# Patient Record
Sex: Female | Born: 1955 | Race: White | Hispanic: No
Health system: Southern US, Community
[De-identification: ages and names within clinical notes are randomized; demographics above are authoritative.]

---

## 2012-02-17 ENCOUNTER — Observation Stay: Payer: Self-pay | Admitting: Student

## 2012-02-17 LAB — URINALYSIS, COMPLETE
Bilirubin,UR: NEGATIVE
Glucose,UR: NEGATIVE mg/dL (ref 0–75)
Leukocyte Esterase: NEGATIVE
Nitrite: NEGATIVE
Ph: 5 (ref 4.5–8.0)
Specific Gravity: 1.027 (ref 1.003–1.030)
Squamous Epithelial: 4
WBC UR: 3 /HPF (ref 0–5)

## 2012-02-17 LAB — CBC
MCH: 31.8 pg (ref 26.0–34.0)
Platelet: 196 10*3/uL (ref 150–440)
RBC: 3.79 10*6/uL — ABNORMAL LOW (ref 3.80–5.20)
WBC: 4.1 10*3/uL (ref 3.6–11.0)

## 2012-02-17 LAB — BASIC METABOLIC PANEL
Anion Gap: 7 (ref 7–16)
Calcium, Total: 8.1 mg/dL — ABNORMAL LOW (ref 8.5–10.1)
Creatinine: 1.03 mg/dL (ref 0.60–1.30)
EGFR (African American): >60
EGFR (Non-African Amer.): >60
Glucose: 90 mg/dL (ref 65–99)
Osmolality: 291 (ref 275–301)
Potassium: 3.1 mmol/L — ABNORMAL LOW (ref 3.5–5.1)

## 2012-02-17 LAB — DRUG SCREEN, URINE
Amphetamines, Ur Screen: NEGATIVE (ref ?–1000)
Barbiturates, Ur Screen: NEGATIVE (ref ?–200)
Benzodiazepine, Ur Scrn: NEGATIVE (ref ?–200)
Cannabinoid 50 Ng, Ur ~~LOC~~: NEGATIVE (ref ?–50)
MDMA (Ecstasy)Ur Screen: NEGATIVE (ref ?–500)
Methadone, Ur Screen: NEGATIVE (ref ?–300)
Opiate, Ur Screen: POSITIVE (ref ?–300)
Tricyclic, Ur Screen: NEGATIVE (ref ?–1000)

## 2012-02-17 LAB — HEPATIC FUNCTION PANEL A (ARMC)
Albumin: 3.5 g/dL (ref 3.4–5.0)
Bilirubin, Direct: <0.1 mg/dL (ref 0.00–0.20)
Bilirubin,Total: 0.2 mg/dL (ref 0.2–1.0)
SGOT(AST): 22 U/L (ref 15–37)
SGPT (ALT): 15 U/L (ref 12–78)

## 2012-02-17 LAB — ACETAMINOPHEN LEVEL: Acetaminophen: <2 ug/mL

## 2012-02-18 LAB — BASIC METABOLIC PANEL
Anion Gap: 8 (ref 7–16)
BUN: 11 mg/dL (ref 7–18)
Chloride: 113 mmol/L — ABNORMAL HIGH (ref 98–107)
Creatinine: 0.92 mg/dL (ref 0.60–1.30)
EGFR (African American): >60
Osmolality: 285 (ref 275–301)
Potassium: 3.2 mmol/L — ABNORMAL LOW (ref 3.5–5.1)

## 2012-02-18 LAB — MAGNESIUM: Magnesium: 2.2 mg/dL

## 2013-12-17 IMAGING — CT CT HEAD WITHOUT CONTRAST
1 series · 16 of 30 positions shown, 20 images · non-contrast
Comparison: none

REASON FOR EXAM: ams
COMMENTS:

PROCEDURE:     CT  - CT HEAD WITHOUT CONTRAST  - February 17, 2012  [DATE]
RESULT:     History: Overdose.
Comparison Study: No prior.

[Series 2: soft tissue · axial · 0.39mm/px · z∈[-34,+101]mm · 16 of 31 slices shown, 20 images]
[im 2/31  brain]
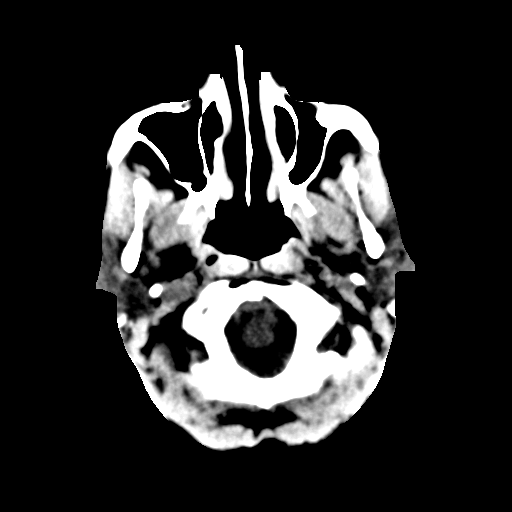
[im 2/31  bone]
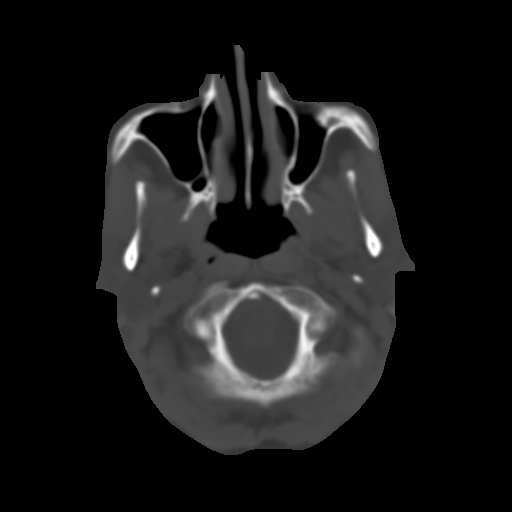
[im 4/31  brain]
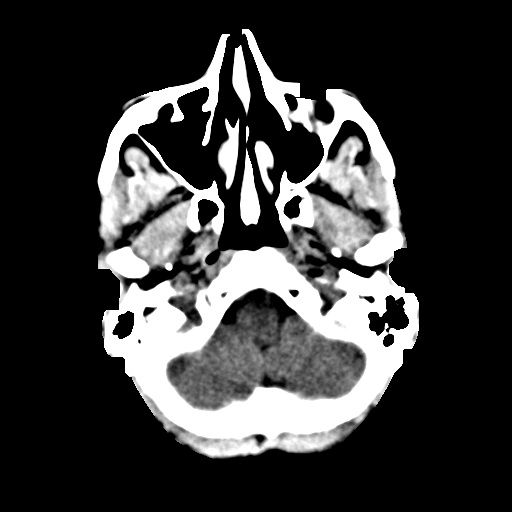
[im 6/31  brain]
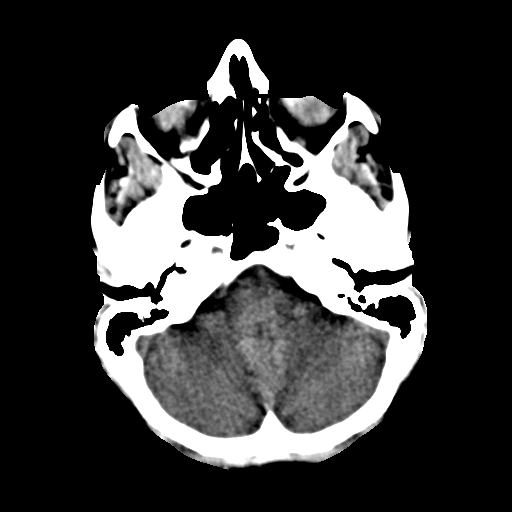
[im 8/31  brain]
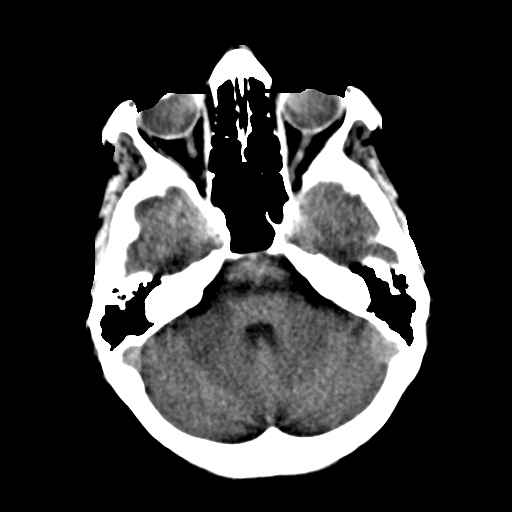
[im 9/31  brain]
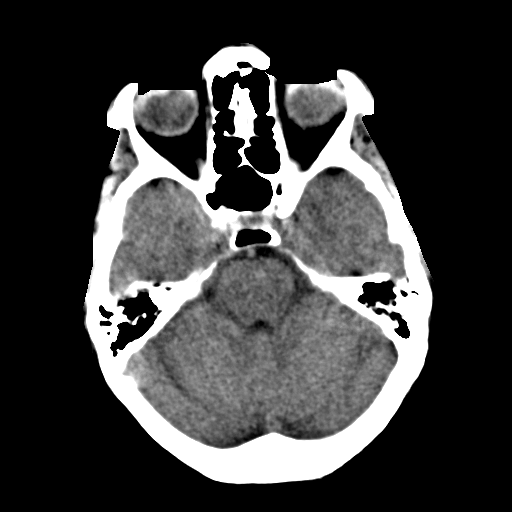
[im 9/31  bone]
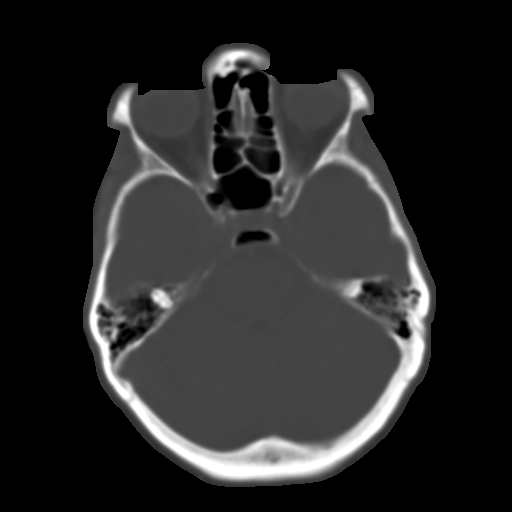
[im 11/31  brain]
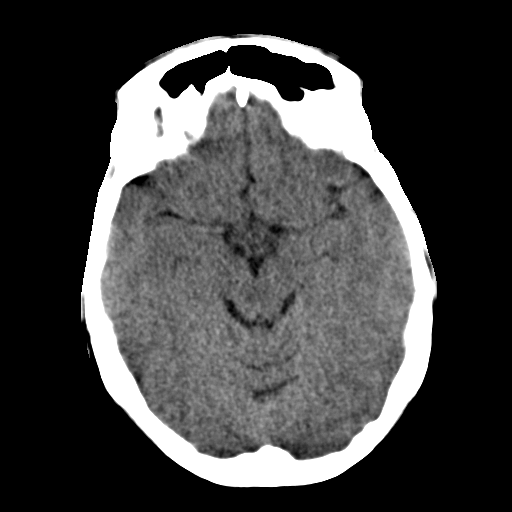
[im 13/31  brain]
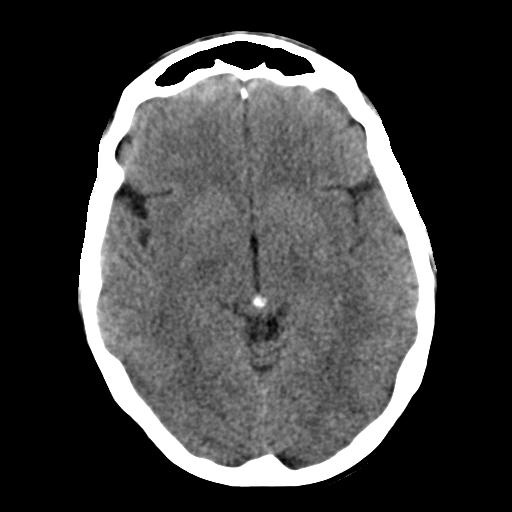
[im 15/31  brain]
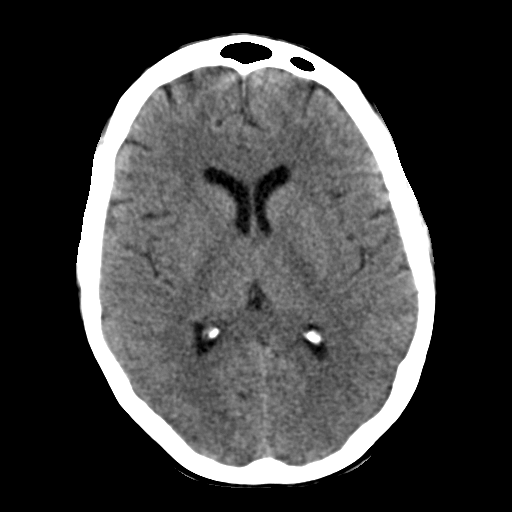
[im 16/31  brain]
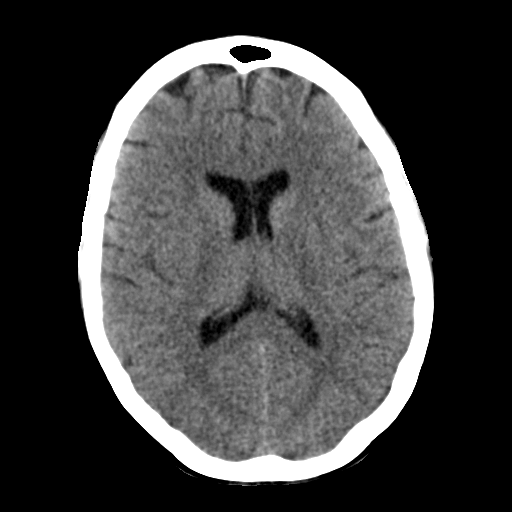
[im 16/31  bone]
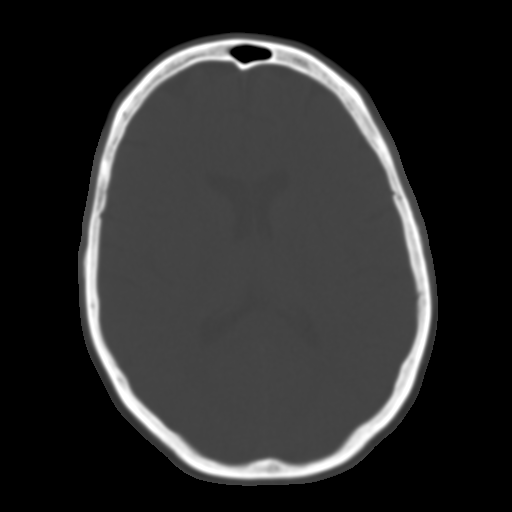
[im 18/31  brain]
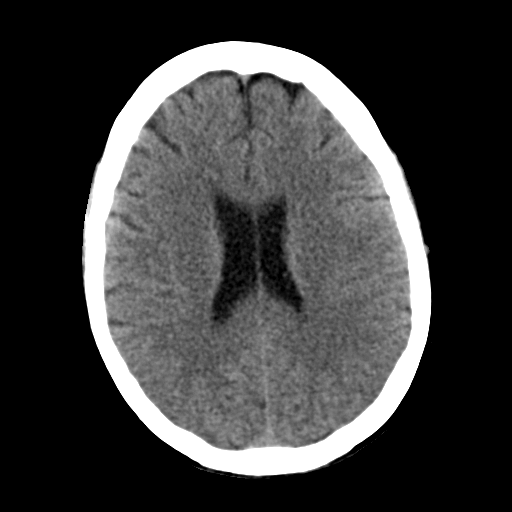
[im 20/31  brain]
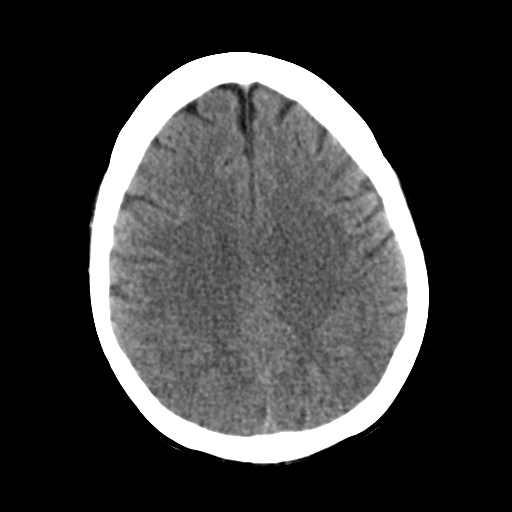
[im 22/31  brain]
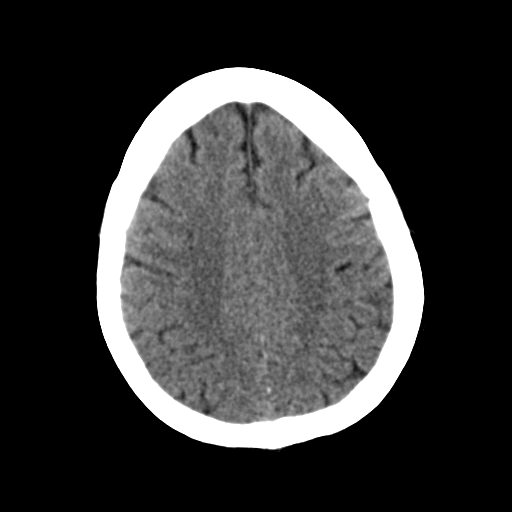
[im 23/31  brain]
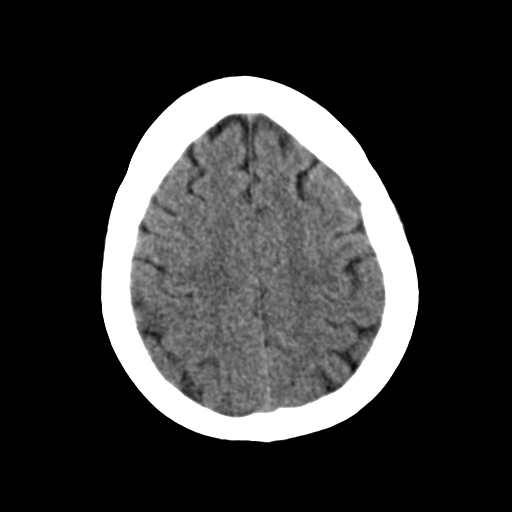
[im 23/31  bone]
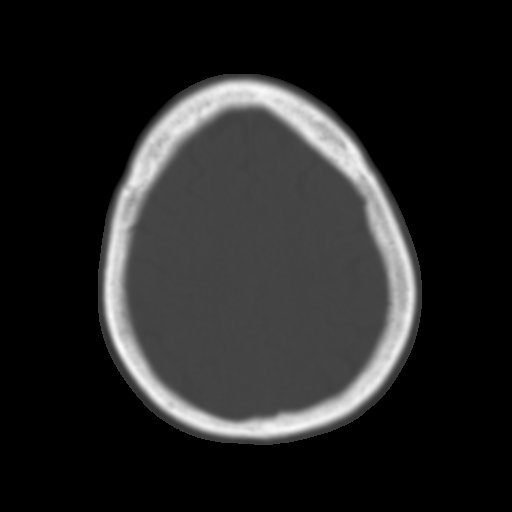
[im 25/31  brain]
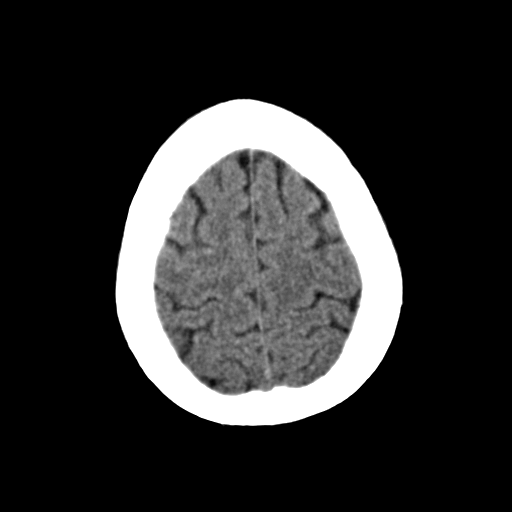
[im 27/31  brain]
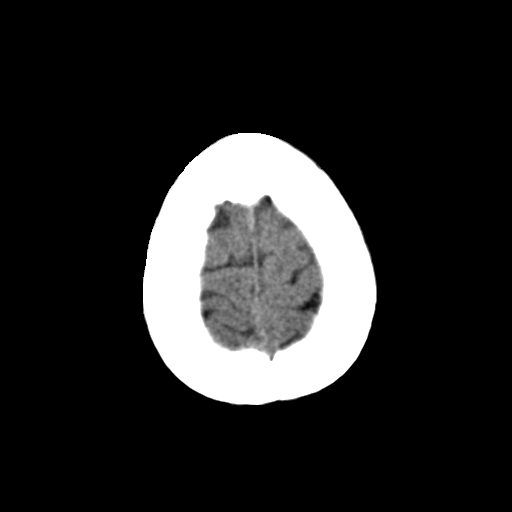
[im 29/31  brain]
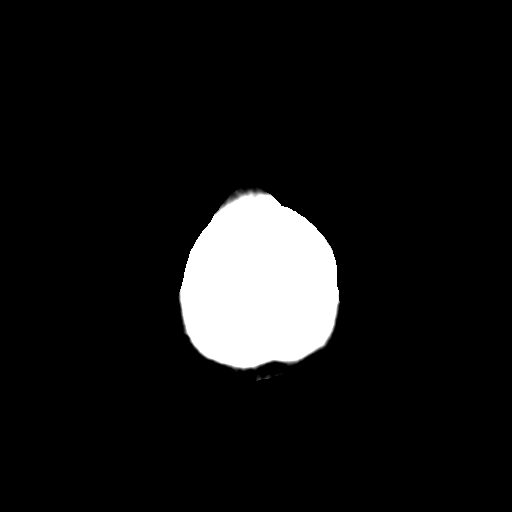

[16 of 30 positions shown; findings below may reference images not displayed]

FINDINGS: No mass. No hydrocephalus. No hemorrhage. No acute bony
abnormality.
IMPRESSION: No acute abnormality.

## 2014-10-30 NOTE — H&P (Signed)
PATIENT NAME:  Teresa Thornton, Teresa Thornton DATE OF BIRTH:  09-08-1955  DATE OF ADMISSION:  02/17/2012  PRIMARY CARE PHYSICIAN: None local.   REFERRING PHYSICIAN: Dr. Clemens Catholicagsdale   CHIEF COMPLAINT: Confusion and sleepy for one day.   HISTORY OF PRESENT ILLNESS: The patient is a 59 year old Caucasian female with a history of diabetes, seizure disorder, and restless leg syndrome who presented to the ED with confusion and sleepy for about one day. Now the patient is alert and awake. According to the patient and her husband, the patient has not slept well for the past three nights. She has questionable seizure disorder and last night she had questionable seizure. She took Ativan 2 mg tablets for 2 tablets. Actually she should take 1 tablet 2 mg p.r.n. once daily but instead she took two tablets. After taking Ativan she has been sleepy since last night. Even today her husband could not wake her up. Dr. Clemens Catholicagsdale, the ED physician, requests we admit the patient for observation. The patient said that she feels dizzy and weak. She also has double vision and blurred vision but she denies any fever or chills. No chest pain, palpitation, no cough, sputum, shortness of breath. No dysuria or hematuria. She feels unsteady gait.   PAST MEDICAL HISTORY:  1. Diabetes. 2. Seizure disorder. 3. Restless leg syndrome.    PAST SURGICAL HISTORY: 1. Bilateral corrective foot surgery.  2. Skin cancer removed from face. 3. Hysterectomy.   ALLERGIES: Amoxicillin, morphine, Reglan.  MEDICATIONS: 1. Topamax 25 mg p.o. 3 tablets every 12 hours. 2. Ativan 2 mg p.o. t.i.d. p.r.n.  3. The patient took Cipro for urinary tract infection last week.  4. Tylenol p.r.n. every eight hours.   SOCIAL HISTORY: Denies any smoking, alcohol drinking, or illicit drugs.   FAMILY HISTORY: Mother had a heart attack.   REVIEW OF SYSTEMS: The patient denies any fever or chills. No headache but has dizziness, weakness, and weight loss  recently. EYES: Positive for double vision, blurred vision. ENT: No dysphagia, slurred speech, or epistaxis. RESPIRATORY: No cough, sputum, shortness of breath, or hematemesis. GI: No abdominal pain, nausea, vomiting, or diarrhea. No melena or bloody stool. CARDIOVASCULAR: No chest pain, palpitation, orthopnea, or nocturnal dyspnea. GU: No dysuria, hematuria, or incontinence. HEMATOLOGIC: No easy bruising or bleeding. NEUROLOGY: No syncope, loss of consciousness, or seizure but is sleepy, has weakness and dizziness. PSYCHIATRY: No depression. No anxiety.   PHYSICAL EXAMINATION:   VITAL SIGNS: Temperature 97.4, blood pressure 108/56, pulse 57, oxygen saturation on room air 98%.   GENERAL: The patient is awake, alert in no acute distress.   HEENT: Pupils are round, equal, reactive to light and accommodation. Moist oral mucosa. Clear oropharynx.   NECK: Supple. No JVD or carotid bruit. No lymphadenopathy. No thyromegaly.   CARDIOVASCULAR: S1, S2 regular rate and rhythm. No murmurs, gallops.   PULMONARY: Bilateral air entry. No wheezing or rales.   ABDOMEN: Soft. No distention. No tenderness. No organomegaly. Bowel sounds present.   EXTREMITIES: No edema, clubbing, or cyanosis. No calf tenderness. Strong bilateral pedal pulses.   SKIN: No rash or jaundice.   NEUROLOGY: Alert and oriented x3. No focal deficit. Power 5/5. Sensation intact. Deep tendon reflexes 2+. The patient looks sleepy and tired.   LABORATORY DATA: WBC 4.1, hemoglobin 12.1, platelets 196, glucose 90, BUN 14, creatinine 1.03, sodium 146, potassium 3.1, chloride 115. Acetaminophen level less than 2. Liver function tests normal. ABG showed pH 7.31, pCO2 42. Urinalysis is negative for urinary  tract infection. Urine toxicology showed opiate positive.  CAT scan of head no acute abnormality.   EKG shows sinus bradycardia at 56 beats per minute.   IMPRESSION:  1. Altered mental status possibly due to medication, Ativan.   2. Hypokalemia.  3. Sinus bradycardia.  4. Diabetes.  5. Seizure disorder.  6. Restless leg syndrome.   PLAN OF TREATMENT:  1. The patient will be placed for observation.  2. We will start IV fluid support and hold Ativan.  3. For diabetes we will start sliding scale.  4. For seizure disorder continue Topamax. 5. We will give potassium supplement and follow-up BMP and magnesium level.   I discussed the patient's situation and plan of treatment with the patient and the patient's husband.   TIME SPENT: About 45 minutes.  ____________________________ Shaune Pollack, MD qc:drc D: 02/17/2012 20:04:32 ET T: 02/18/2012 06:02:54 ET JOB#: [id number]  cc: Shaune Pollack, MD, <Dictator>  Shaune Pollack MD ELECTRONICALLY SIGNED 02/18/2012 15:30

## 2014-10-30 NOTE — Discharge Summary (Signed)
PATIENT NAME:  Teresa Thornton, Teresa Thornton MR#:  [id number] DATE OF BIRTH:  [birth year]  DATE OF ADMISSION:  02/17/2012 DATE OF DISCHARGE:  02/18/2012  [facility] COMPLAINT: Confusion, sleepy for one day.   DISCHARGE DIAGNOSES:  1. Lethargy and altered mental status from Ativan overdose.  2. Hyponatremia.  3. Hypokalemia.  4. History of seizure disorder, probably atypical spells without any epileptiform focus per her primary neurologist.  5. History of restless leg syndrome.   DISCHARGE MEDICATION: Topamax 75 mg every 12 hours.   LABORATORY, DIAGNOSTIC AND RADIOLOGICAL DATA: Sodium on arrival 146, by discharge 144. Potassium on arrival 3.1, on day of discharge 3.2, it was repleted. Urine toxicology positive for opiates. Urinalysis not suggestive of infection. CT of the head negative for any acute events.   HOSPITAL COURSE: Full details of history and physical please see the dictation by Dr. Imogene Burnhen on 02/17/2012, but this is a 59 year old female with the above chief complaint. Apparently patient has been taking Ativan 0.5 mg b.i.d. p.r.n. for anxiety given to her by her primary care physician. She had history of these seizures which are preceded by an aura and she was told to take an Ativan before these auras to prevent the seizure coming. A couple of days ago as they were traveling from [place] here she took 2 mg tablets x2 and had confusion, lethargy, increased sleepiness. She was admitted to the hospitalist service. She had a negative CT of the head for acute events. She had mild hyponatremia and hypokalemia which were repleted. The Ativan was stopped. I had a long conversation with the patient, her husband, as well as her primary neurologist at [place]. Apparently patient has been having these spells for a while. She is on Topamax. She was evaluated by her primary neurologist in [place] and underwent an EEG which did not show any epileptic activity. The Topamax was continued. She had increased frequency of these  events. [facility] and underwent a video EEG for six days per patient. This was also negative per the neurologist, although she had some aural symptoms no epileptiform activity was found and she was discharged with continuation of Topamax. Then somehow she went to another [place] ER and was given 2 mg doses of Ativan and was told to take these if you see a seizure coming and that is what she took prior to admission. She completed as gone back to her baseline. Given the above at this point she was strongly discouraged from taking any more high dose Ativan. She will be discharged with Topamax and instructions to follow with her primary care physician as well as primary neurologist for further follow up. She will be going back to [place] today.   CODE STATUS: Patient is FULL CODE.   TOTAL TIME SPENT: 60 minutes.   DISPOSITION: Home.  ____________________________ Krystal EatonShayiq Tamu Golz, MD sa:cms D: 02/18/2012 12:00:18 ET T: 02/18/2012 12:30:50 ET JOB#: [id number]  cc: Krystal EatonShayiq Yasmen Cortner, MD, <Dictator>  Krystal EatonSHAYIQ Arnella Pralle MD ELECTRONICALLY SIGNED 03/04/2012 15:43
